# Patient Record
Sex: Female | Born: 1960 | Race: White | Hispanic: No | Marital: Married | Smoking: Never smoker
Health system: Southern US, Community
[De-identification: ages and names within clinical notes are randomized; demographics above are authoritative.]

---

## 2018-07-04 ENCOUNTER — Other Ambulatory Visit: Payer: Self-pay | Admitting: Unknown Physician Specialty

## 2018-07-04 DIAGNOSIS — I6529 Occlusion and stenosis of unspecified carotid artery: Secondary | ICD-10-CM

## 2018-07-12 ENCOUNTER — Ambulatory Visit
Admission: RE | Admit: 2018-07-12 | Discharge: 2018-07-12 | Disposition: A | Discharge status: Home / Self Care | Payer: PRIVATE HEALTH INSURANCE | Source: Ambulatory Visit | Attending: Unknown Physician Specialty | Admitting: Unknown Physician Specialty

## 2018-07-12 DIAGNOSIS — I6529 Occlusion and stenosis of unspecified carotid artery: Secondary | ICD-10-CM

## 2019-01-04 ENCOUNTER — Other Ambulatory Visit: Payer: Self-pay

## 2019-01-04 ENCOUNTER — Encounter (HOSPITAL_COMMUNITY): Payer: Self-pay

## 2019-01-04 ENCOUNTER — Ambulatory Visit (HOSPITAL_COMMUNITY)
Admission: EM | Admit: 2019-01-04 | Discharge: 2019-01-04 | Disposition: A | Discharge status: Home / Self Care | Payer: BLUE CROSS/BLUE SHIELD | Attending: Family Medicine | Admitting: Family Medicine

## 2019-01-04 DIAGNOSIS — J019 Acute sinusitis, unspecified: Secondary | ICD-10-CM | POA: Diagnosis present

## 2019-01-04 LAB — POCT RAPID STREP A: Streptococcus, Group A Screen (Direct): NEGATIVE

## 2019-01-04 MED ORDER — AMOXICILLIN-POT CLAVULANATE 875-125 MG PO TABS: 1.0000 | ORAL_TABLET | Freq: Two times a day (BID) | ORAL | 0 refills | Status: AC

## 2019-01-04 MED ORDER — CETIRIZINE HCL 10 MG PO CAPS: 10.0000 mg | ORAL_CAPSULE | Freq: Every day | ORAL | 0 refills | Status: AC

## 2019-01-04 MED ORDER — PREDNISONE 50 MG PO TABS: 50.0000 mg | ORAL_TABLET | Freq: Every day | ORAL | 0 refills | Status: AC

## 2019-01-04 NOTE — ED Triage Notes (Signed)
Pt cc sore throat and coughing x 1 week.

## 2019-01-04 NOTE — Discharge Instructions (Addendum)
Please begin Augmentin twice daily for the next week Begin prednisone daily with food on your stomach for the next 5 days Continue Delsym for cough  Sore Throat  Your rapid strep tested Negative today. We will send for a culture and call in about 2 days if results are positive. For now we will treat your sore throat as a virus with symptom management.   Please continue Tylenol or Ibuprofen for fever and pain. May try salt water gargles, cepacol lozenges, throat spray, or OTC cold relief medicine for throat discomfort. If you also have congestion take a daily anti-histamine like Zyrtec, Claritin, and a oral decongestant to help with post nasal drip that may be irritating your throat.   Stay hydrated and drink plenty of fluids to keep your throat coated relieve irritation.    Honey Tea For sore throat try using a honey-based tea. Use 3 teaspoons of honey with juice squeezed from half lemon. Place shaved pieces of ginger into 1/2-1 cup of water and warm over stove top. Then mix the ingredients and repeat every 4 hours as needed.

## 2019-01-04 NOTE — ED Provider Notes (Signed)
[facility]    CSN: [id number] Arrival date & time: 01/04/19  1626     History   Chief Complaint Chief Complaint  Patient presents with  . Sore Throat    HPI Katherine Bishop is a 58 y.o. female no significant past medical history, Patient is presenting with URI symptoms- congestion, cough, sore throat. Patient's main complaints are cough, worse at nighttime. Symptoms have been going on for 9 days. Patient has tried Delsym which did help with cough last night, with minimal relief. Denies fever, nausea, vomiting, diarrhea. Denies shortness of breath and chest pain.    HPI  History reviewed. No pertinent past medical history.  There are no active problems to display for this patient.   History reviewed. No pertinent surgical history.  OB History   No obstetric history on file.      Home Medications    Prior to Admission medications   Medication Sig Start Date End Date Taking? Authorizing Provider  amoxicillin-clavulanate (AUGMENTIN) 875-125 MG tablet Take 1 tablet by mouth every 12 (twelve) hours. 01/04/19   Ayahna Solazzo C, PA-C  Cetirizine HCl 10 MG CAPS Take 1 capsule (10 mg total) by mouth daily for 10 days. 01/04/19 01/14/19  Ifrah Vest C, PA-C  predniSONE (DELTASONE) 50 MG tablet Take 1 tablet (50 mg total) by mouth daily for 5 days. 01/04/19 01/09/19  Swathi Dauphin, Junius Creamer, PA-C    Family History No family history on file.  Social History Social History   Tobacco Use  . Smoking status: Never Smoker  . Smokeless tobacco: Never Used  Substance Use Topics  . Alcohol use: Never    Frequency: Never  . Drug use: Never     Allergies   Aspirin and Codone [hydrocodone]   Review of Systems Review of Systems  Constitutional: Negative for activity change, appetite change, chills, fatigue and fever.  HENT: Positive for congestion, rhinorrhea and sore throat. Negative for ear pain, sinus pressure and trouble swallowing.   Eyes: Negative for  discharge and redness.  Respiratory: Positive for cough. Negative for chest tightness and shortness of breath.   Cardiovascular: Negative for chest pain.  Gastrointestinal: Negative for abdominal pain, diarrhea, nausea and vomiting.  Musculoskeletal: Negative for myalgias.  Skin: Negative for rash.  Neurological: Negative for dizziness, light-headedness and headaches.     Physical Exam Triage Vital Signs ED Triage Vitals  Enc Vitals Group     BP 01/04/19 1705 140/80     Pulse Rate 01/04/19 1705 84     Resp 01/04/19 1705 16     Temp 01/04/19 1705 98.2 F (36.8 C)     Temp Source 01/04/19 1705 Oral     SpO2 01/04/19 1705 100 %     Weight 01/04/19 1707 245 lb (111.1 kg)     Height --      Head Circumference --      Peak Flow --      Pain Score 01/04/19 1707 6     Pain Loc --      Pain Edu? --      Excl. in GC? --    No data found.  Updated Vital Signs BP 140/80 (BP Location: Right Arm)   Pulse 84   Temp 98.2 F (36.8 C) (Oral)   Resp 16   Wt 245 lb (111.1 kg)   SpO2 100%   Visual Acuity Right Eye Distance:   Left Eye Distance:   Bilateral Distance:    Right Eye Near:  Left Eye Near:    Bilateral Near:     Physical Exam Vitals signs and nursing note reviewed.  Constitutional:      General: She is not in acute distress.    Appearance: She is well-developed.  HENT:     Head: Normocephalic and atraumatic.     Ears:     Comments: Bilateral ears without tenderness to palpation of external auricle, tragus and mastoid, EAC's without erythema or swelling, TM's with good bony landmarks and cone of light. Non erythematous.    Nose:     Comments: Nasal mucosa erythematous, rhinorrhea present bilaterally    Mouth/Throat:     Comments: Oral mucosa pink and moist, no tonsillar enlargement or exudate. Posterior pharynx patent and nonerythematous, no uvula deviation or swelling. Normal phonation. Eyes:     Conjunctiva/sclera: Conjunctivae normal.  Neck:      Musculoskeletal: Neck supple.  Cardiovascular:     Rate and Rhythm: Normal rate and regular rhythm.     Heart sounds: No murmur.  Pulmonary:     Effort: Pulmonary effort is normal. No respiratory distress.     Breath sounds: Normal breath sounds.     Comments: Breathing comfortably at rest, CTABL, no wheezing, rales or other adventitious sounds auscultated Abdominal:     Palpations: Abdomen is soft.     Tenderness: There is no abdominal tenderness.  Skin:    General: Skin is warm and dry.  Neurological:     Mental Status: She is alert.      UC Treatments / Results  Labs (all labs ordered are listed, but only abnormal results are displayed) Labs Reviewed  CULTURE, GROUP A STREP [facility])  POCT RAPID STREP A    EKG None  Radiology No results found.  Procedures Procedures (including critical care time)  Medications Ordered in UC Medications - No data to display  Initial Impression / Assessment and Plan / UC Course  I have reviewed the triage vital signs and the nursing notes.  Pertinent labs & imaging results that were available during my care of the patient were reviewed by me and considered in my medical decision making (see chart for details).     Vital signs stable, exam nonfocal, URI symptoms x9 days.  We will go ahead and treat for sinusitis with Augmentin, prednisone.  Also recommended cetirizine to help with any congestion and drainage.  Continue Delsym.  Further sore throat recommendations below.Discussed strict return precautions. Patient verbalized understanding and is agreeable with plan.  Final Clinical Impressions(s) / UC Diagnoses   Final diagnoses:  Acute sinusitis with symptoms > 10 days     Discharge Instructions     Please begin Augmentin twice daily for the next week Begin prednisone daily with food on your stomach for the next 5 days Continue Delsym for cough  Sore Throat  Your rapid strep tested Negative today. We will send for a culture  and call in about 2 days if results are positive. For now we will treat your sore throat as a virus with symptom management.   Please continue Tylenol or Ibuprofen for fever and pain. May try salt water gargles, cepacol lozenges, throat spray, or OTC cold relief medicine for throat discomfort. If you also have congestion take a daily anti-histamine like Zyrtec, Claritin, and a oral decongestant to help with post nasal drip that may be irritating your throat.   Stay hydrated and drink plenty of fluids to keep your throat coated relieve irritation.    Honey  Tea For sore throat try using a honey-based tea. Use 3 teaspoons of honey with juice squeezed from half lemon. Place shaved pieces of ginger into 1/2-1 cup of water and warm over stove top. Then mix the ingredients and repeat every 4 hours as needed.    ED Prescriptions    Medication Sig Dispense Auth. Provider   amoxicillin-clavulanate (AUGMENTIN) 875-125 MG tablet Take 1 tablet by mouth every 12 (twelve) hours. 14 tablet Dredyn Gubbels C, PA-C   predniSONE (DELTASONE) 50 MG tablet Take 1 tablet (50 mg total) by mouth daily for 5 days. 5 tablet Lyssa Hackley C, PA-C   Cetirizine HCl 10 MG CAPS Take 1 capsule (10 mg total) by mouth daily for 10 days. 10 capsule Janayla Marik C, PA-C     Controlled Substance Prescriptions [place] Controlled Substance Registry consulted? Not Applicable   Lew DawesWieters, Kylieann Eagles C, [place]PA-C 01/04/19 1903

## 2019-01-07 LAB — CULTURE, GROUP A STREP (THRC)

## 2020-01-19 IMAGING — US US CAROTID DUPLEX BILAT
1 series · 13 of 24 positions shown · non-contrast
Comparison: None.

CLINICAL DATA: Carotid stenosis.  Remote TIA.  On Plavix.

EXAM:
BILATERAL CAROTID DUPLEX ULTRASOUND
TECHNIQUE: Gray scale imaging, color Doppler and duplex ultrasound was
performed of bilateral carotid and vertebral arteries in the neck.
TECHNIQUE: Quantification of carotid stenosis is based on velocity parameters
that correlate the residual internal carotid diameter with
NASCET-based stenosis levels, using the diameter of the distal
internal carotid lumen as the denominator for stenosis measurement.

[Series 1: us carotid duplex bilat · 0.06mm/px · 13 of 45 slices shown]
[im 1/45]
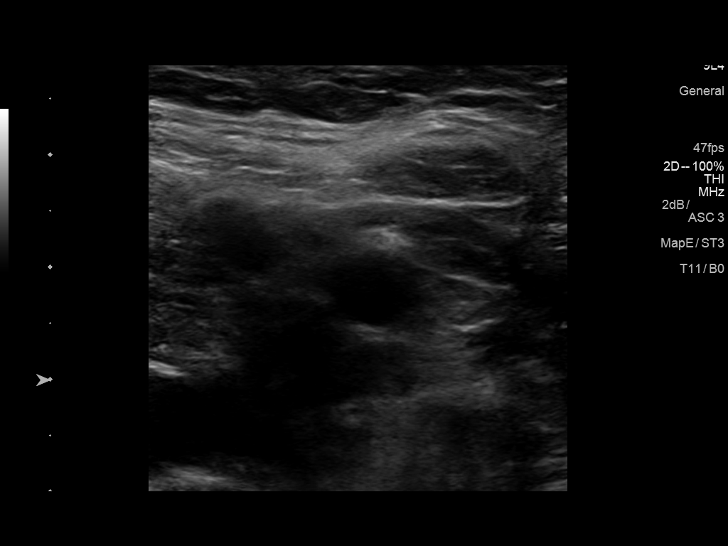
[im 4/45]
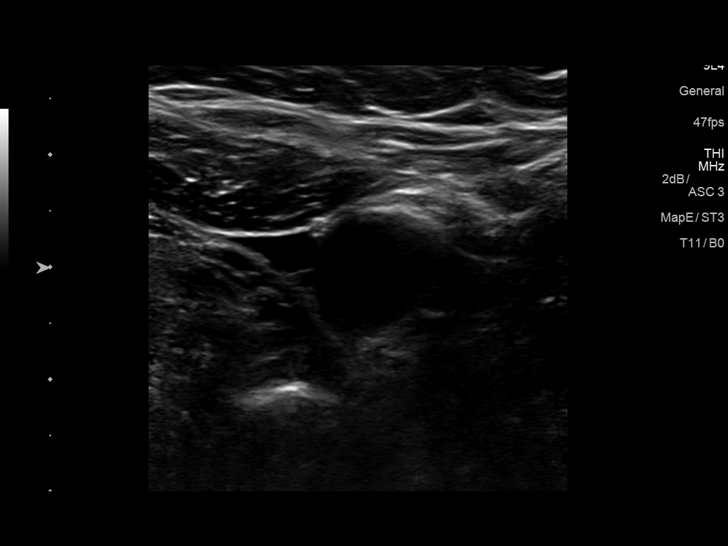
[im 8/45]
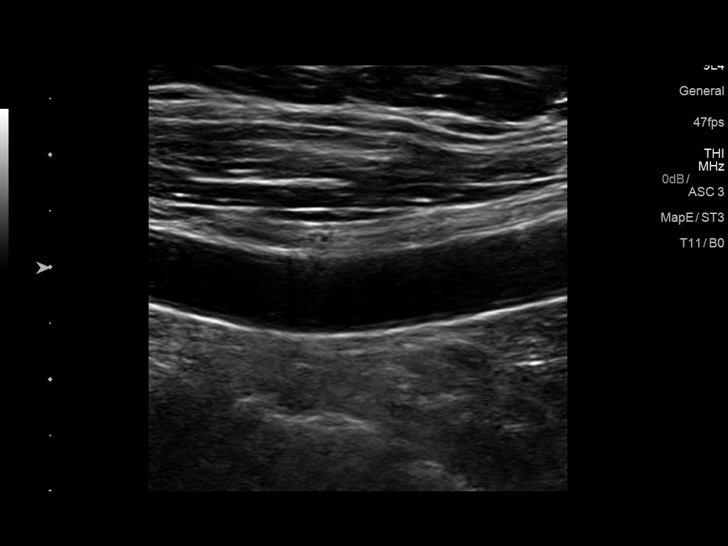
[im 12/45]
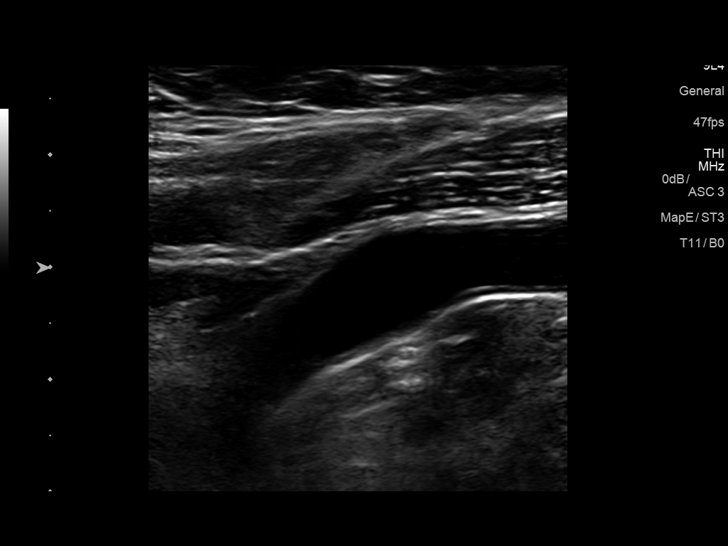
[im 16/45]
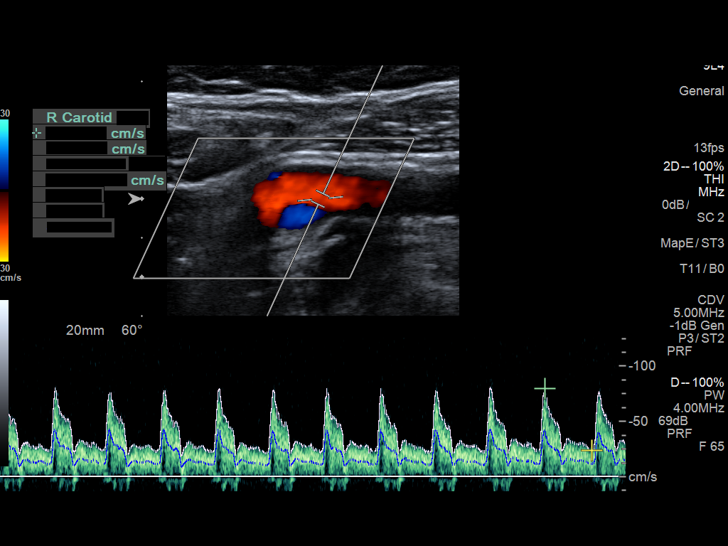
[im 20/45]
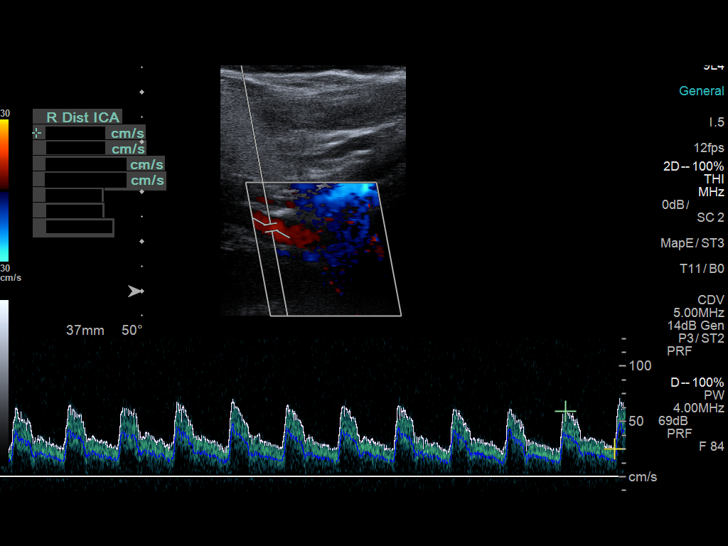
[im 23/45]
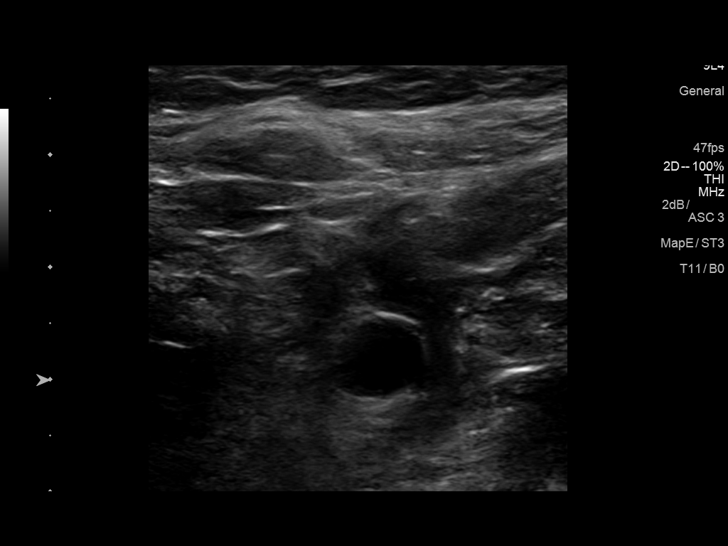
[im 25/45]
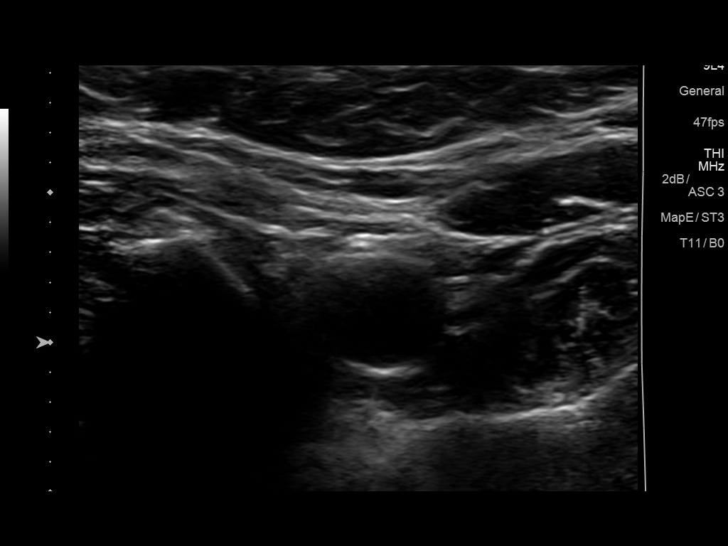
[im 29/45]
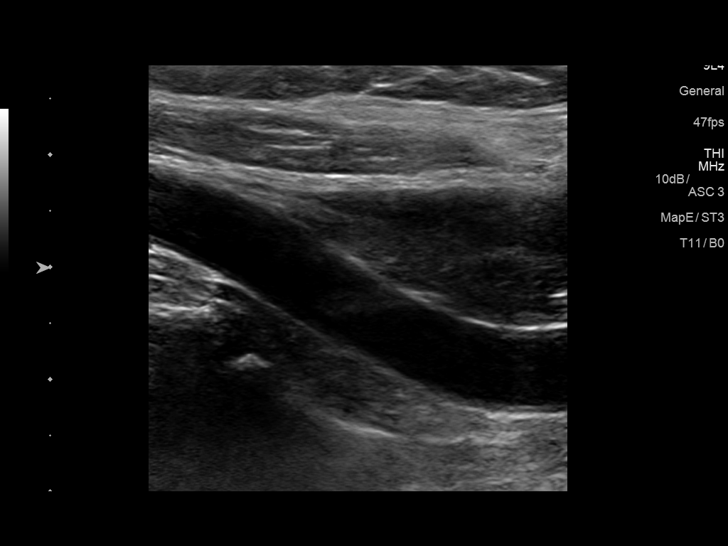
[im 33/45]
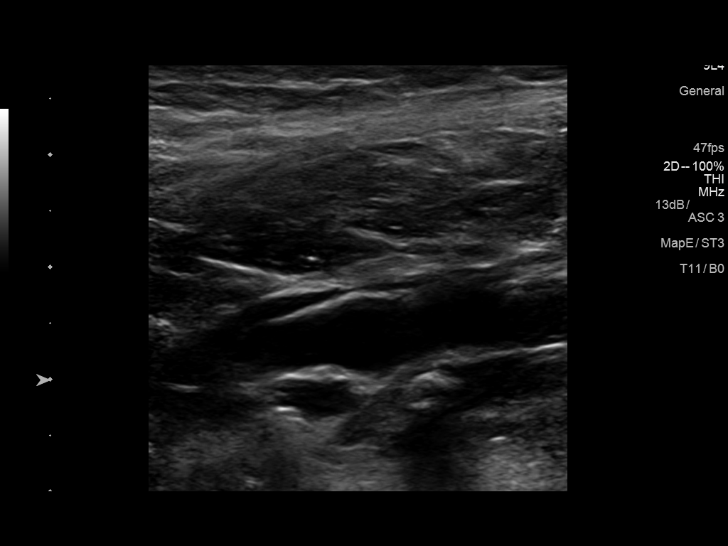
[im 37/45]
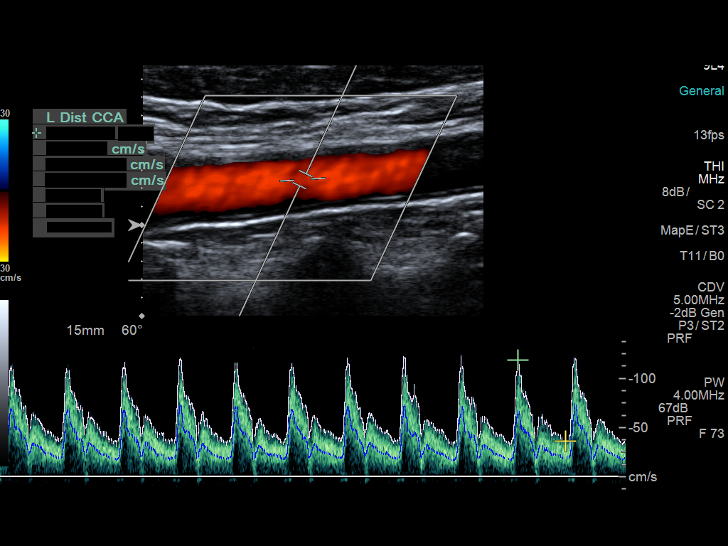
[im 41/45]
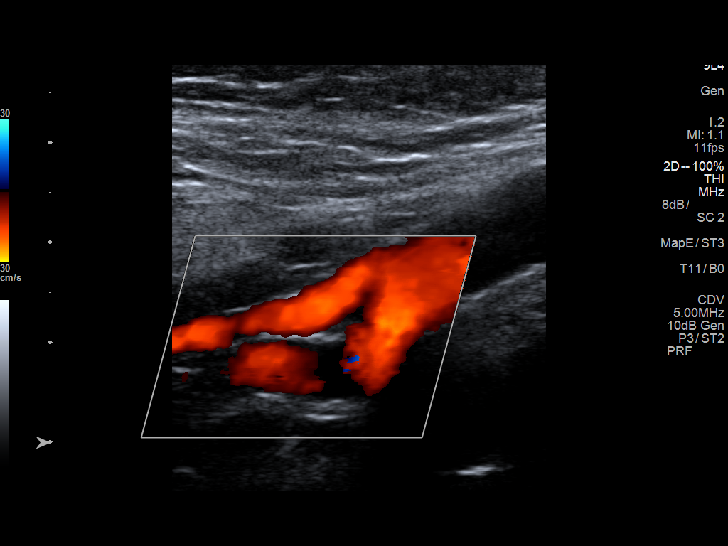
[im 45/45]
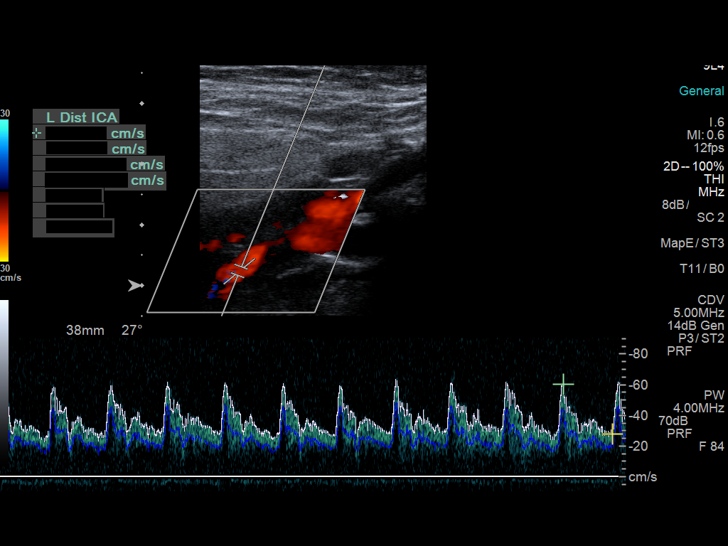

[13 of 24 positions shown; findings below may reference images not displayed]

The following velocity measurements were obtained:

PEAK SYSTOLIC/END DIASTOLIC

RIGHT

ICA:                     87/26cm/sec

CCA:                     107/32cm/sec

SYSTOLIC ICA/CCA RATIO:

ECA:                     90cm/sec

LEFT

ICA:                     80/28cm/sec

CCA:                     119/36cm/sec

SYSTOLIC ICA/CCA RATIO:

ECA:                     104cm/sec
FINDINGS: RIGHT CAROTID ARTERY: Mild tortuosity. No significant plaque or
stenosis. Normal waveforms and color Doppler signal.

RIGHT VERTEBRAL ARTERY:  Normal flow direction and waveform.

LEFT CAROTID ARTERY: No significant plaque or stenosis. Mild
tortuosity. Normal waveforms and color Doppler signal.

LEFT VERTEBRAL ARTERY: Sonographer reports antegrade flow but no
image is submitted.
IMPRESSION: 1. No significant carotid plaque or stenosis.
2. Antegrade  vertebral arterial flow.

## 2020-10-09 ENCOUNTER — Other Ambulatory Visit: Payer: Self-pay | Admitting: Unknown Physician Specialty

## 2020-10-09 DIAGNOSIS — I6529 Occlusion and stenosis of unspecified carotid artery: Secondary | ICD-10-CM

## 2020-10-17 ENCOUNTER — Ambulatory Visit
Admission: RE | Admit: 2020-10-17 | Discharge: 2020-10-17 | Disposition: A | Discharge status: Home / Self Care | Payer: BLUE CROSS/BLUE SHIELD | Source: Ambulatory Visit | Attending: *Deleted | Admitting: *Deleted

## 2020-10-17 DIAGNOSIS — I6529 Occlusion and stenosis of unspecified carotid artery: Secondary | ICD-10-CM

## 2021-11-06 ENCOUNTER — Other Ambulatory Visit: Payer: Self-pay | Admitting: Physician Assistant

## 2021-11-06 DIAGNOSIS — I6529 Occlusion and stenosis of unspecified carotid artery: Secondary | ICD-10-CM

## 2021-11-17 ENCOUNTER — Other Ambulatory Visit: Payer: BC Managed Care – PPO

## 2021-11-24 ENCOUNTER — Ambulatory Visit
Admission: RE | Admit: 2021-11-24 | Discharge: 2021-11-24 | Disposition: A | Discharge status: Home / Self Care | Payer: Self-pay | Source: Ambulatory Visit | Attending: Physician Assistant | Admitting: Physician Assistant

## 2021-11-24 DIAGNOSIS — I6529 Occlusion and stenosis of unspecified carotid artery: Secondary | ICD-10-CM

## 2022-03-19 IMAGING — US US CAROTID DUPLEX BILAT
1 series · 13 of 24 positions shown · non-contrast
Comparison: 07/12/2018

CLINICAL DATA: 59-year-old female with a history of carotid
stenosis

EXAM:
BILATERAL CAROTID DUPLEX ULTRASOUND
TECHNIQUE: Gray scale imaging, color Doppler and duplex ultrasound were
performed of bilateral carotid and vertebral arteries in the neck.

[Series 1: us carotid duplex bilat · 0.06mm/px · 13 of 61 slices shown]
[im 1/61]
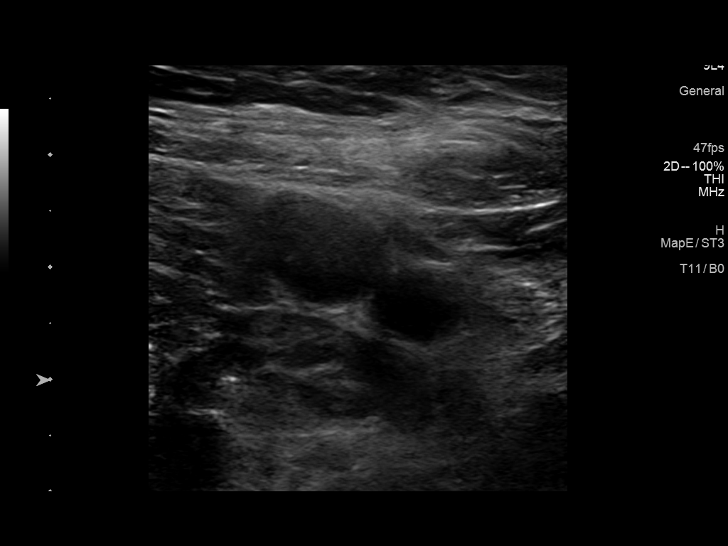
[im 6/61]
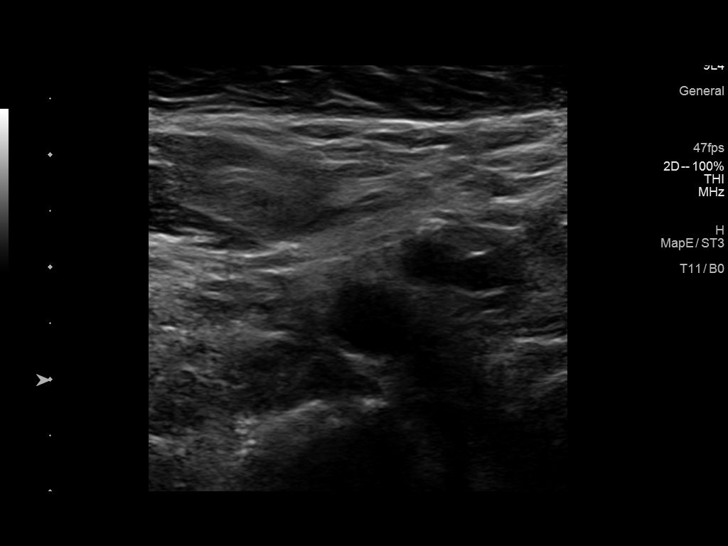
[im 11/61]
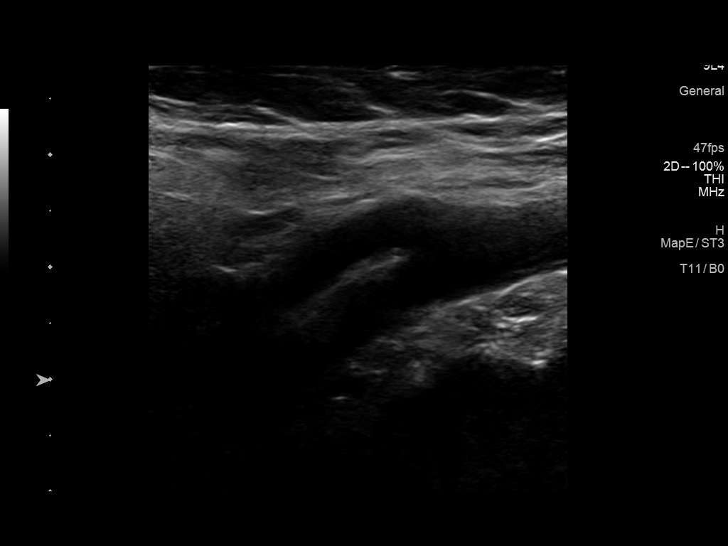
[im 16/61]
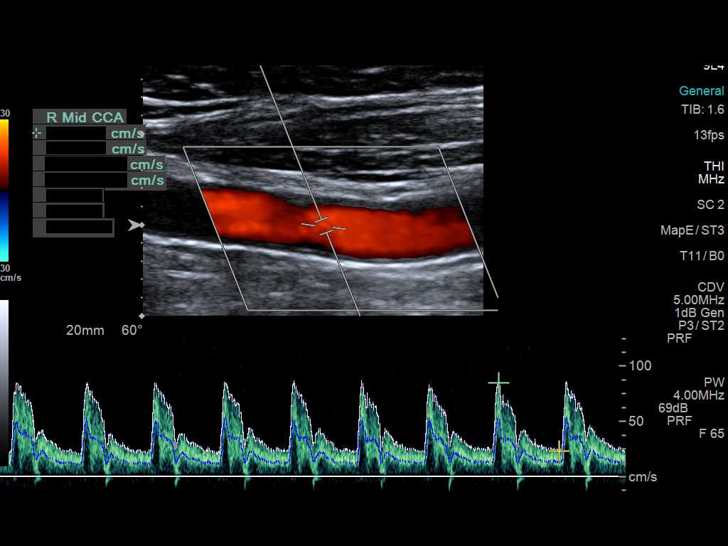
[im 21/61]
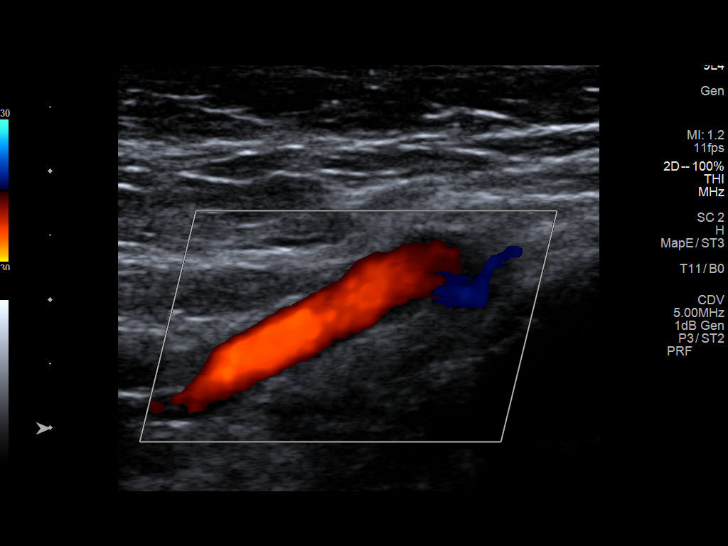
[im 27/61]
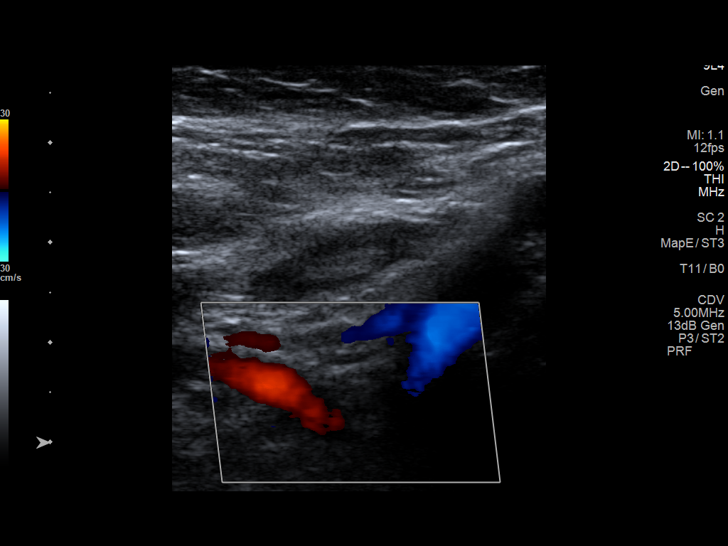
[im 32/61]
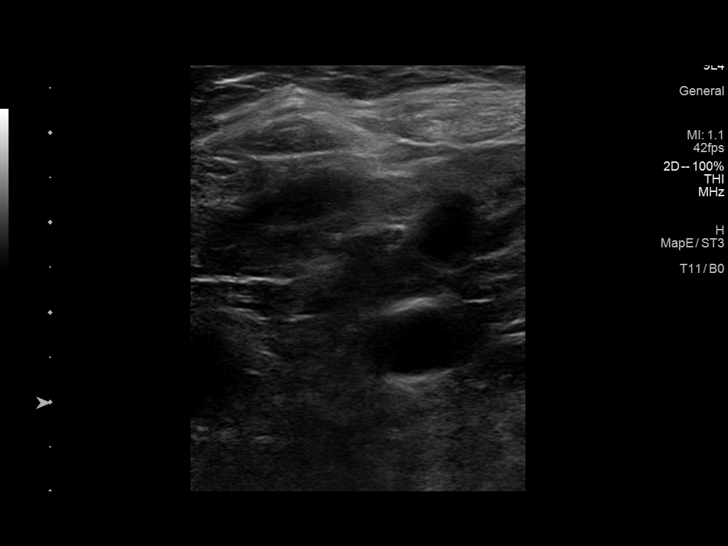
[im 34/61]
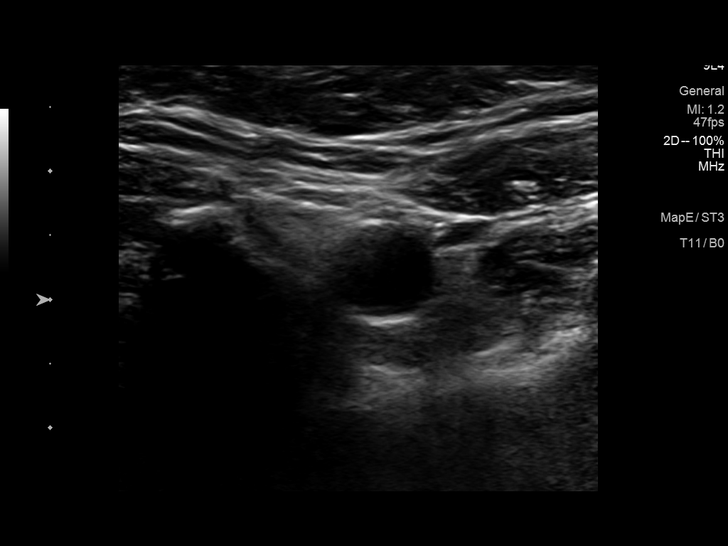
[im 40/61]
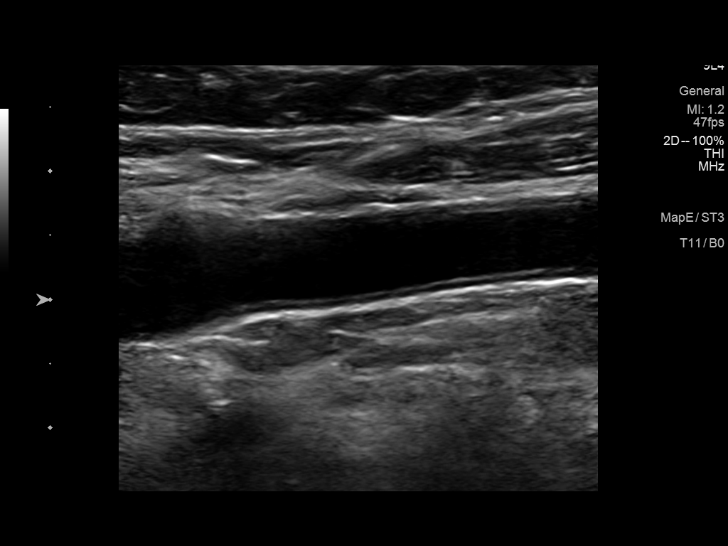
[im 45/61]
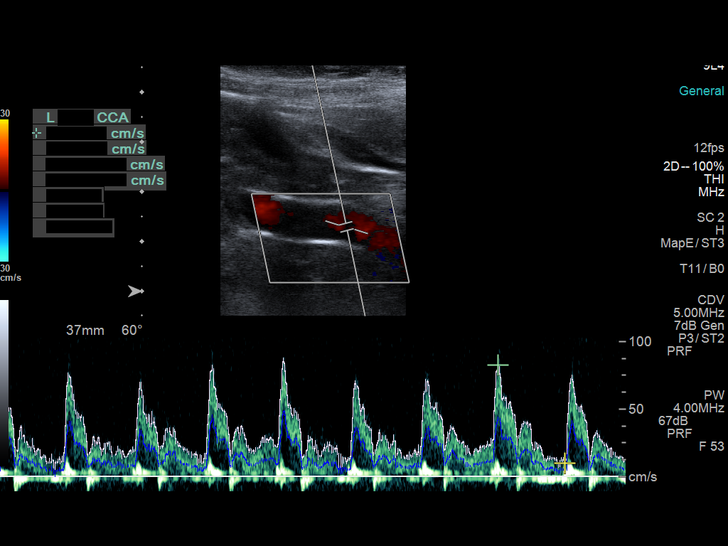
[im 50/61]
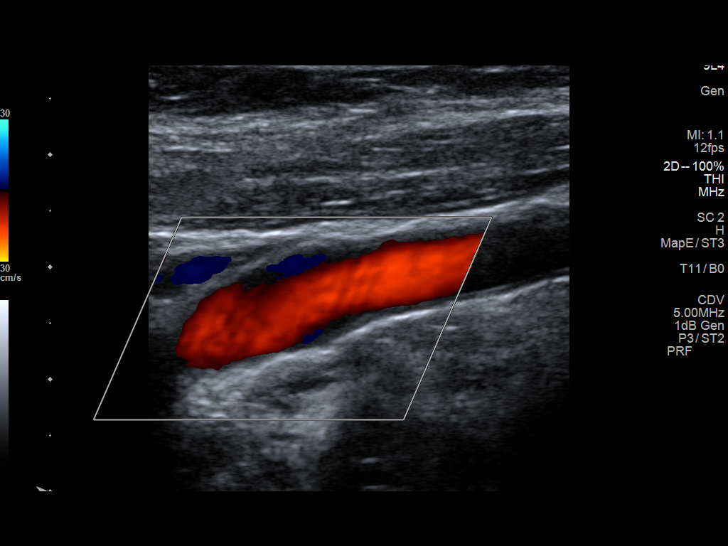
[im 55/61]
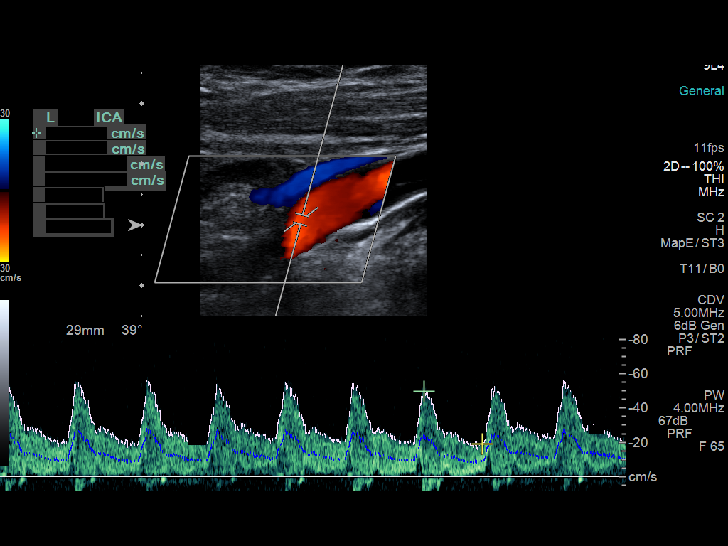
[im 61/61]
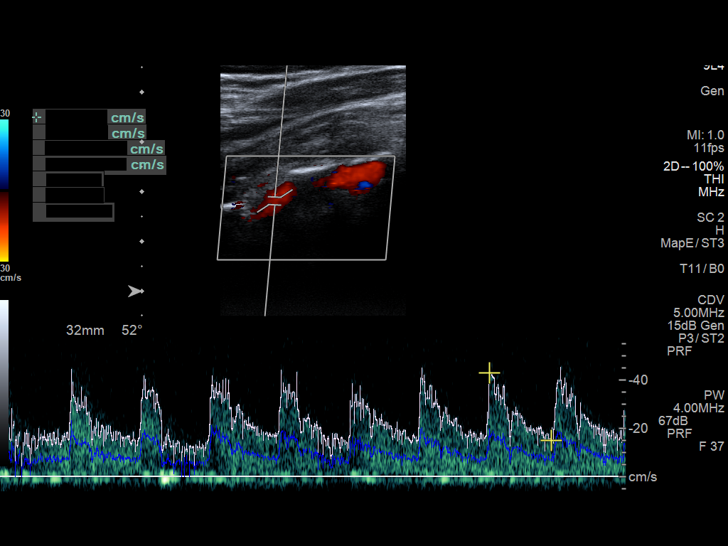

[13 of 24 positions shown; findings below may reference images not displayed]

FINDINGS: Criteria: Quantification of carotid stenosis is based on velocity
parameters that correlate the residual internal carotid diameter
with NASCET-based stenosis levels, using the diameter of the distal
internal carotid lumen as the denominator for stenosis measurement.

The following velocity measurements were obtained:

RIGHT

ICA:  Systolic 71 cm/sec, Diastolic 29 cm/sec

CCA:  93 cm/sec

SYSTOLIC ICA/CCA RATIO:

ECA:  106 cm/sec

LEFT

ICA:  Systolic 86 cm/sec, Diastolic 30 cm/sec

CCA:  101 cm/sec

SYSTOLIC ICA/CCA RATIO:

ECA:  81 cm/sec

Right Brachial SBP: 156

Left Brachial SBP: 150

RIGHT CAROTID ARTERY: No significant calcified disease of the right
common carotid artery. Intermediate waveform maintained. Homogeneous
plaque without significant calcifications at the right carotid
bifurcation. Low resistance waveform of the right ICA. No
significant tortuosity.

RIGHT VERTEBRAL ARTERY: Antegrade flow with low resistance waveform.

LEFT CAROTID ARTERY: No significant calcified disease of the left
common carotid artery. Intermediate waveform maintained. Homogeneous
plaque at the left carotid bifurcation without significant
calcifications. Low resistance waveform of the left ICA.

LEFT VERTEBRAL ARTERY:  Antegrade flow with low resistance waveform.
IMPRESSION: Color duplex indicates minimal homogeneous plaque, with no
hemodynamically significant stenosis by duplex criteria in the
extracranial cerebrovascular circulation.

## 2022-09-03 ENCOUNTER — Other Ambulatory Visit: Payer: Self-pay | Admitting: Nurse Practitioner

## 2022-09-03 DIAGNOSIS — Z87442 Personal history of urinary calculi: Secondary | ICD-10-CM

## 2022-09-14 ENCOUNTER — Ambulatory Visit
Admission: RE | Admit: 2022-09-14 | Discharge: 2022-09-14 | Disposition: A | Discharge status: Home / Self Care | Payer: BC Managed Care – PPO | Source: Ambulatory Visit | Attending: Nurse Practitioner | Admitting: Nurse Practitioner

## 2022-09-14 DIAGNOSIS — Z87442 Personal history of urinary calculi: Secondary | ICD-10-CM

## 2023-03-11 ENCOUNTER — Other Ambulatory Visit: Payer: Self-pay | Admitting: Neurology

## 2023-03-11 DIAGNOSIS — G252 Other specified forms of tremor: Secondary | ICD-10-CM

## 2023-03-31 ENCOUNTER — Ambulatory Visit
Admission: RE | Admit: 2023-03-31 | Discharge: 2023-03-31 | Disposition: A | Discharge status: Home / Self Care | Payer: BC Managed Care – PPO | Source: Ambulatory Visit | Attending: Neurology | Admitting: Neurology

## 2023-03-31 DIAGNOSIS — G252 Other specified forms of tremor: Secondary | ICD-10-CM

## 2023-04-15 ENCOUNTER — Other Ambulatory Visit: Payer: Self-pay | Admitting: Neurology

## 2023-04-15 DIAGNOSIS — Z8673 Personal history of transient ischemic attack (TIA), and cerebral infarction without residual deficits: Secondary | ICD-10-CM

## 2023-04-23 ENCOUNTER — Ambulatory Visit
Admission: RE | Admit: 2023-04-23 | Discharge: 2023-04-23 | Disposition: A | Discharge status: Home / Self Care | Payer: BC Managed Care – PPO | Source: Ambulatory Visit | Attending: Neurology | Admitting: Neurology

## 2023-04-23 DIAGNOSIS — Z8673 Personal history of transient ischemic attack (TIA), and cerebral infarction without residual deficits: Secondary | ICD-10-CM

## 2023-04-26 IMAGING — US US CAROTID DUPLEX BILAT
1 series · 13 of 24 positions shown · non-contrast
Comparison: 10/17/2020; 07/12/2018

CLINICAL DATA: Carotid artery stenosis.  History of hyperlipidemia.

EXAM:
BILATERAL CAROTID DUPLEX ULTRASOUND
TECHNIQUE: Gray scale imaging, color Doppler and duplex ultrasound were
performed of bilateral carotid and vertebral arteries in the neck.

[Series 1: us carotid duplex bilat · 0.06mm/px · 13 of 61 slices shown]
[im 1/61]
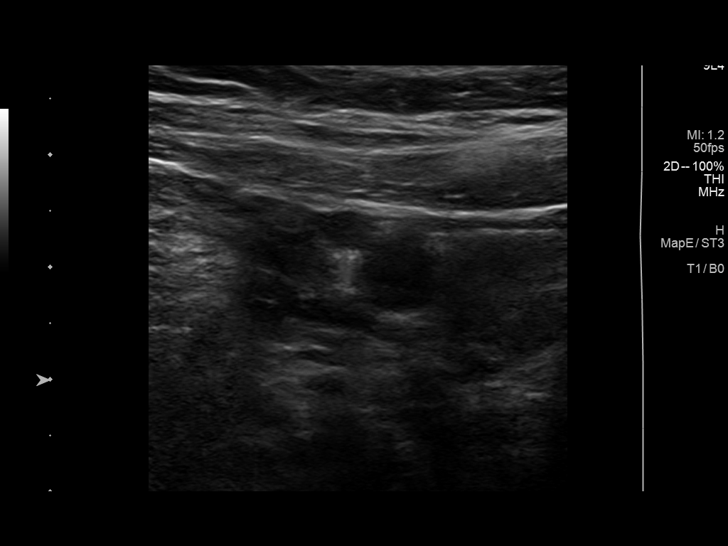
[im 6/61]
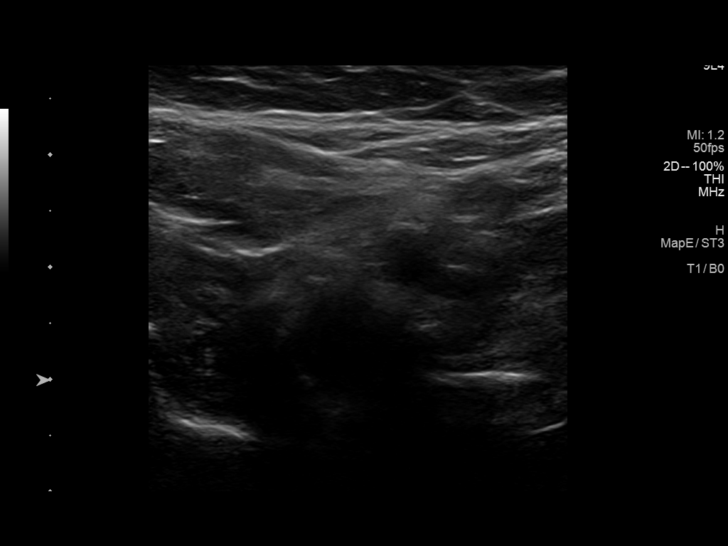
[im 11/61]
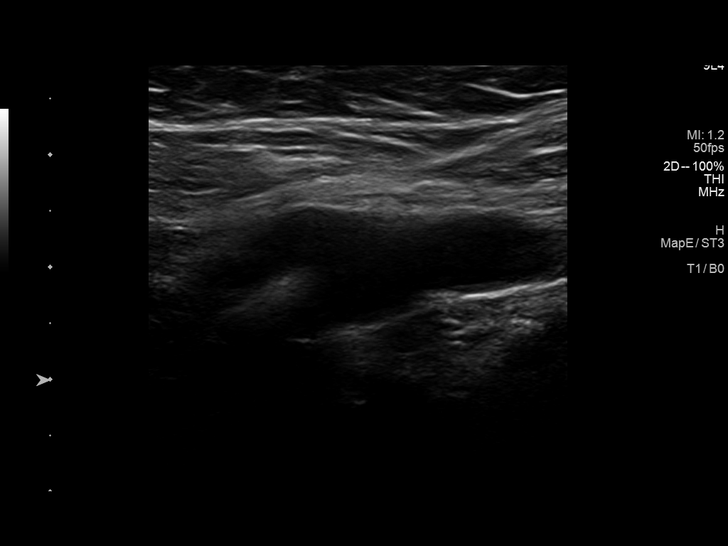
[im 16/61]
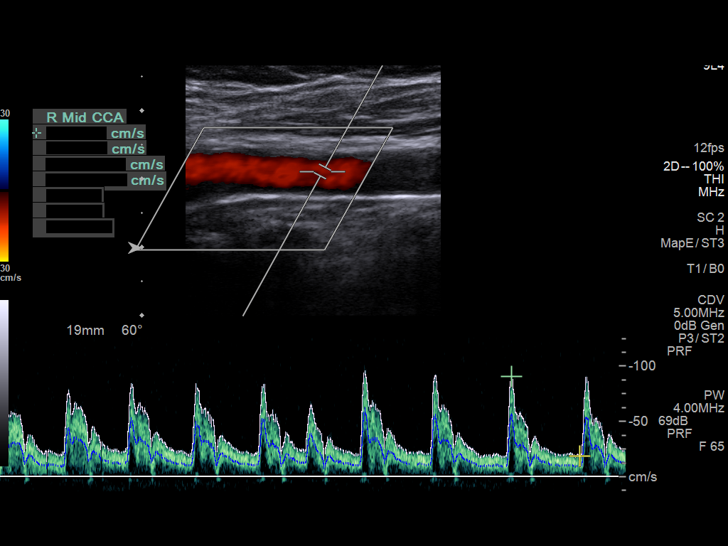
[im 21/61]
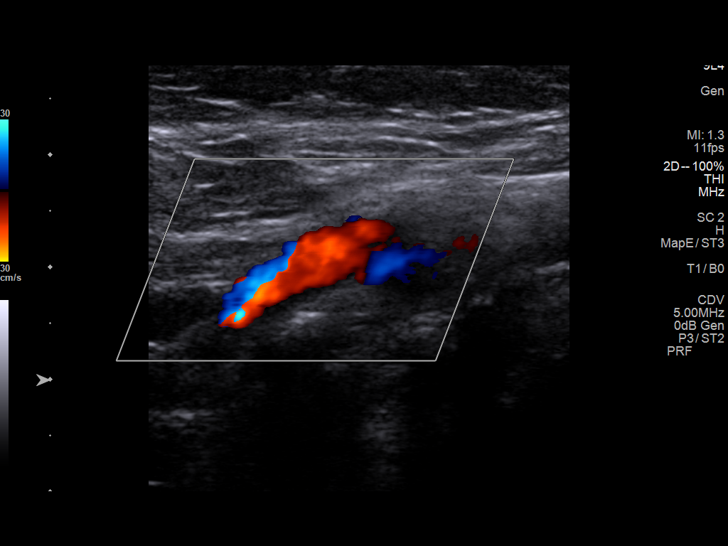
[im 27/61]
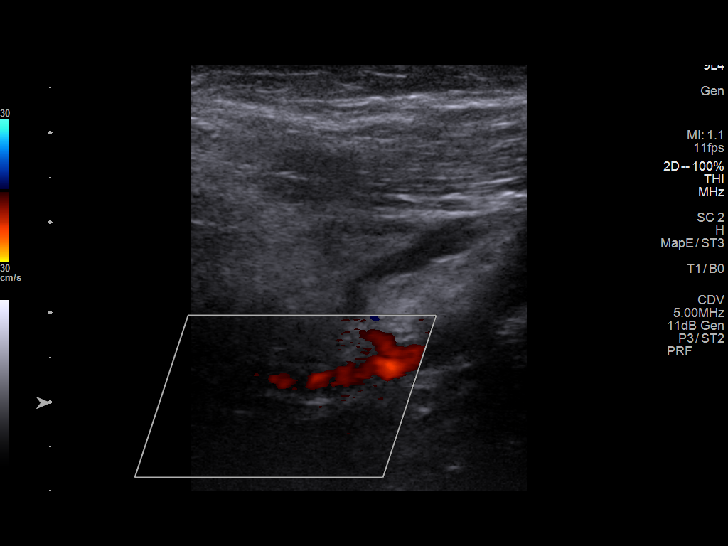
[im 32/61]
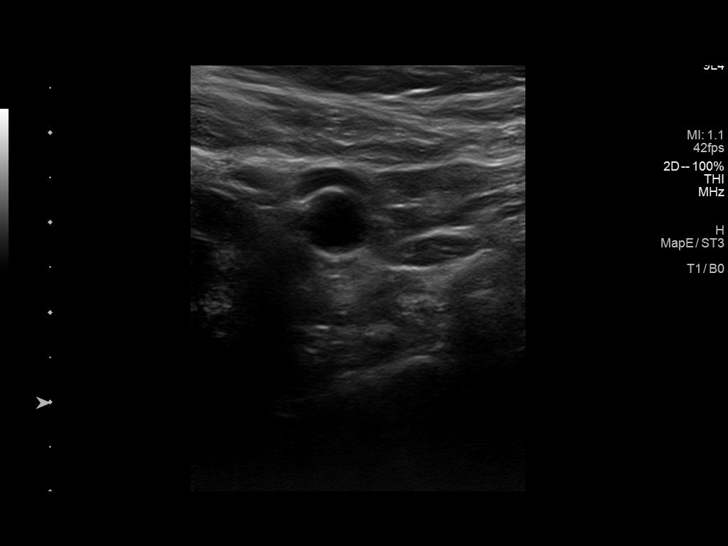
[im 34/61]
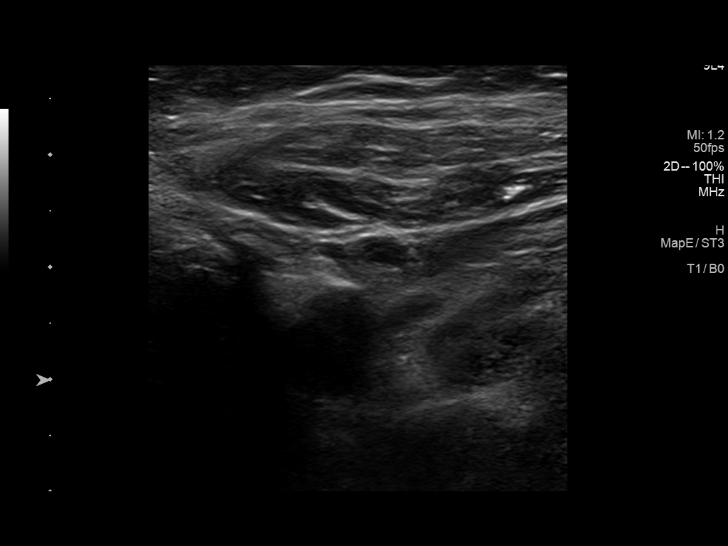
[im 40/61]
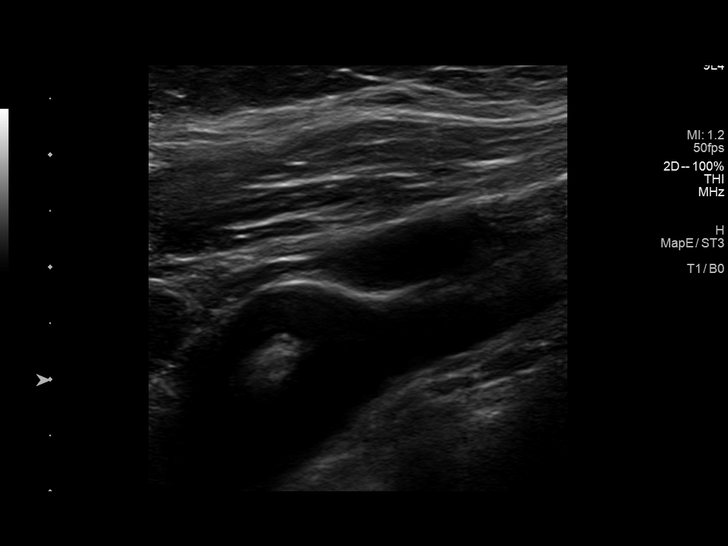
[im 45/61]
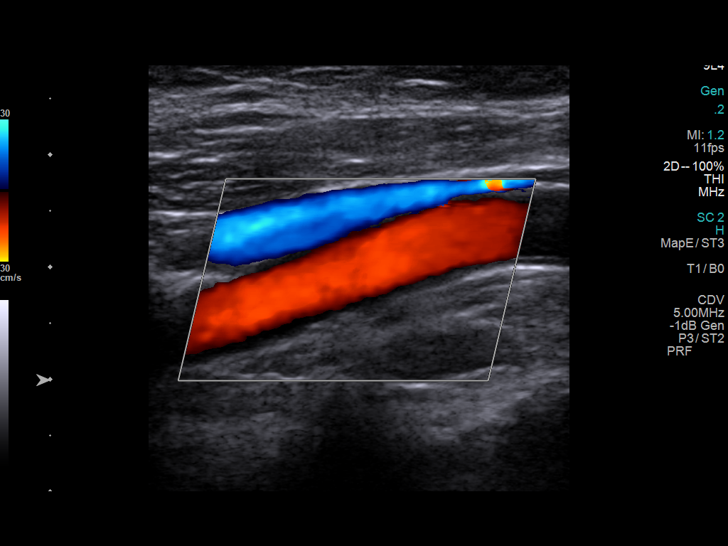
[im 50/61]
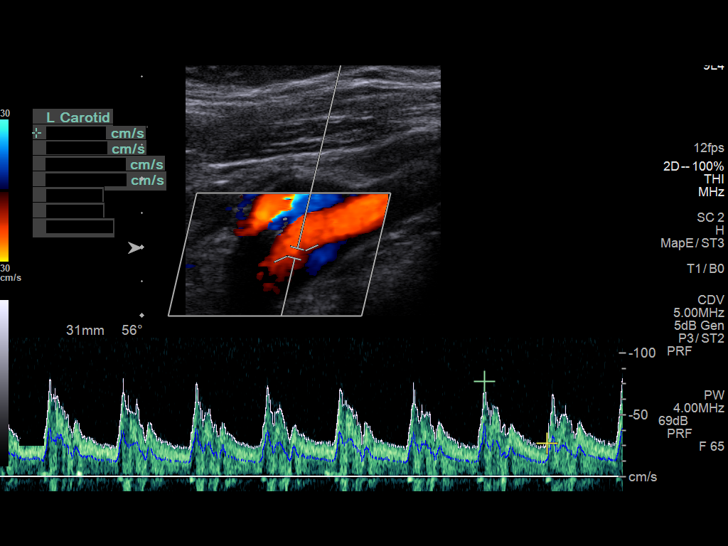
[im 55/61]
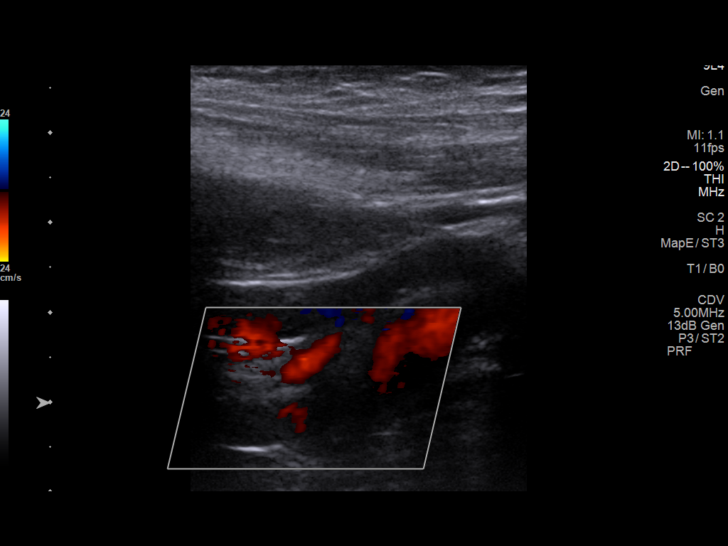
[im 61/61]
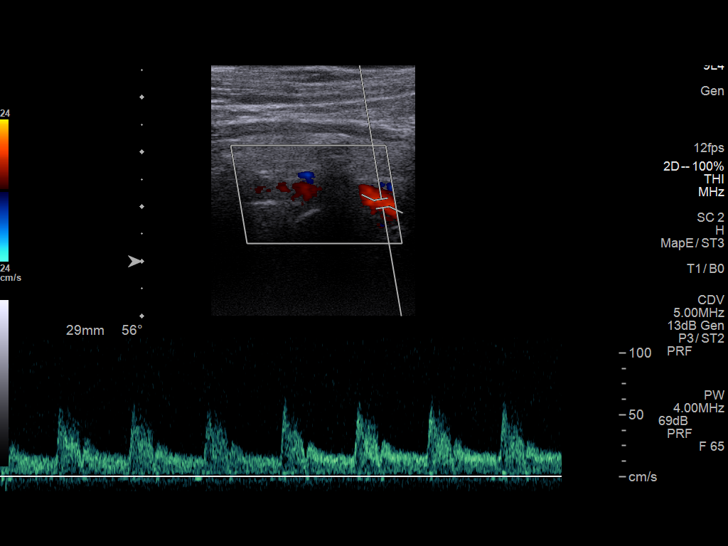

[13 of 24 positions shown; findings below may reference images not displayed]

FINDINGS: Criteria: Quantification of carotid stenosis is based on velocity
parameters that correlate the residual internal carotid diameter
with NASCET-based stenosis levels, using the diameter of the distal
internal carotid lumen as the denominator for stenosis measurement.

The following velocity measurements were obtained:

RIGHT

ICA: 62/12 cm/sec

CCA: 91/18 cm/sec

SYSTOLIC ICA/CCA RATIO:

ECA: 90 cm/sec

LEFT

ICA: 74/20 cm/sec

CCA: 96/30 cm/sec

SYSTOLIC ICA/CCA RATIO:

ECA: 78 cm/sec

RIGHT CAROTID ARTERY: There is no grayscale evidence of significant
intimal thickening or atherosclerotic plaque affecting the
interrogated portions of the right carotid system. There are no
elevated peak systolic velocities within the interrogated course of
the right internal carotid artery to suggest a hemodynamically
significant stenosis.

RIGHT VERTEBRAL ARTERY:  Antegrade flow

LEFT CAROTID ARTERY: There is no grayscale evidence of significant
intimal thickening or atherosclerotic plaque affecting the
interrogated portions of the left carotid system. There are no
elevated peak systolic velocities within the interrogated course of
the left internal carotid artery to suggest a hemodynamically
significant stenosis.

LEFT VERTEBRAL ARTERY:  Antegrade flow

Upper extremity blood pressures: RIGHT: 173/100 LEFT: 175/101
IMPRESSION: 1. Unremarkable carotid Doppler ultrasound.
2. The patient was noted to be hypertensive at the time of this
examination.
